# Patient Record
Sex: Male | Born: 2000 | Race: Black or African American | Hispanic: No | Marital: Single | Smoking: Never smoker
Health system: Southern US, Community
[De-identification: ages and names within clinical notes are randomized; demographics above are authoritative.]

---
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
## 2021-03-13 IMAGING — DX DG CHEST 1V PORT
1 series · 1 of 1 positions shown · non-contrast
Comparison: None.

CLINICAL DATA: Cough

EXAM:
PORTABLE CHEST 1 VIEW

[chest ap]
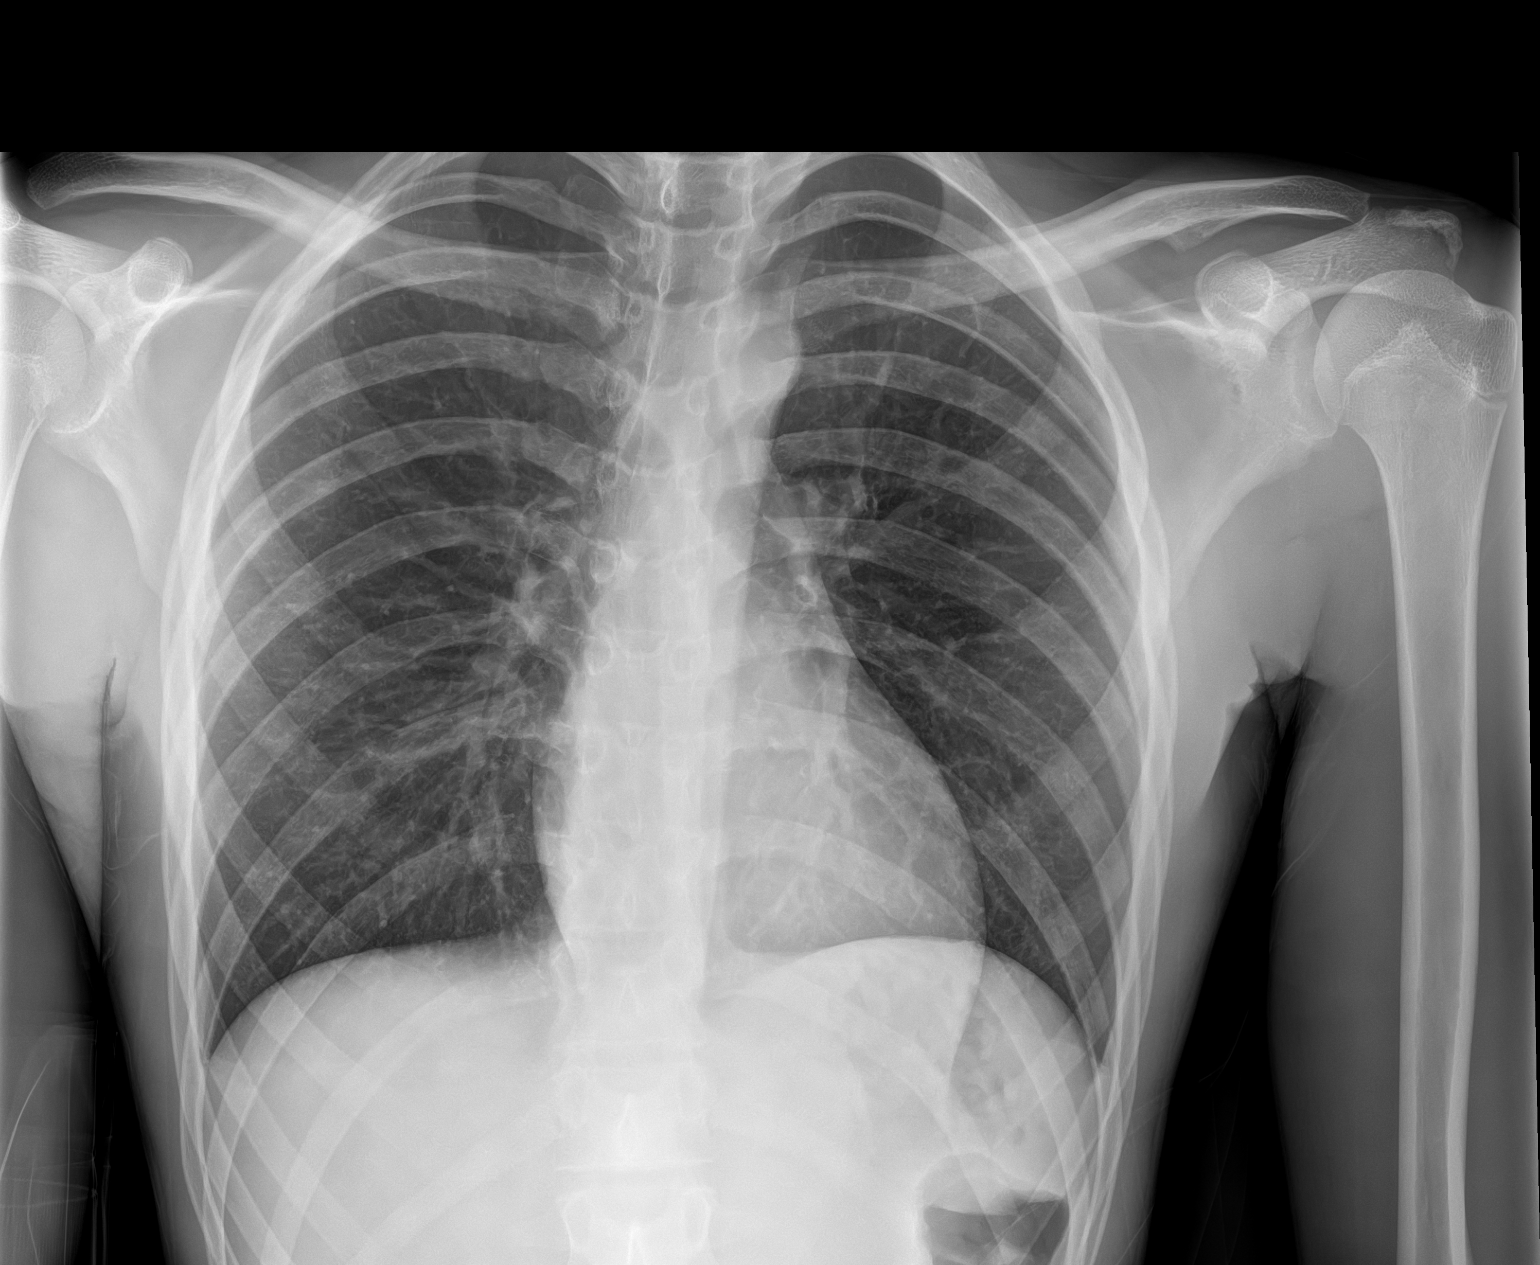

[1 of 1 positions shown; findings below may reference images not displayed]

FINDINGS: The heart size and mediastinal contours are within normal limits.
Both lungs are clear. The visualized skeletal structures are
unremarkable.
IMPRESSION: No active disease.
